# Patient Record
Sex: Male | Born: 1970 | Race: White | Hispanic: No | Marital: Married | Smoking: Current every day smoker
Health system: Southern US, Community
[De-identification: ages and names within clinical notes are randomized; demographics above are authoritative.]

## PROBLEM LIST (undated history)

## (undated) DIAGNOSIS — K219 Gastro-esophageal reflux disease without esophagitis: Secondary | ICD-10-CM

## (undated) DIAGNOSIS — S46219A Strain of muscle, fascia and tendon of other parts of biceps, unspecified arm, initial encounter: Secondary | ICD-10-CM

---

## 2011-03-19 ENCOUNTER — Encounter (HOSPITAL_BASED_OUTPATIENT_CLINIC_OR_DEPARTMENT_OTHER): Payer: Self-pay | Admitting: *Deleted

## 2011-03-20 ENCOUNTER — Other Ambulatory Visit: Payer: Self-pay | Admitting: Orthopedic Surgery

## 2011-03-21 ENCOUNTER — Encounter (HOSPITAL_BASED_OUTPATIENT_CLINIC_OR_DEPARTMENT_OTHER): Payer: Self-pay | Admitting: *Deleted

## 2011-03-21 ENCOUNTER — Encounter (HOSPITAL_BASED_OUTPATIENT_CLINIC_OR_DEPARTMENT_OTHER): Payer: Self-pay | Admitting: Orthopedic Surgery

## 2011-03-21 ENCOUNTER — Encounter (HOSPITAL_BASED_OUTPATIENT_CLINIC_OR_DEPARTMENT_OTHER)
Admission: RE | Disposition: A | Discharge status: Home / Self Care | Payer: Self-pay | Source: Ambulatory Visit | Attending: Orthopedic Surgery

## 2011-03-21 ENCOUNTER — Encounter (HOSPITAL_BASED_OUTPATIENT_CLINIC_OR_DEPARTMENT_OTHER): Payer: Self-pay | Admitting: Anesthesiology

## 2011-03-21 ENCOUNTER — Ambulatory Visit (HOSPITAL_BASED_OUTPATIENT_CLINIC_OR_DEPARTMENT_OTHER): Payer: Worker's Compensation | Admitting: Anesthesiology

## 2011-03-21 ENCOUNTER — Ambulatory Visit (HOSPITAL_BASED_OUTPATIENT_CLINIC_OR_DEPARTMENT_OTHER)
Admission: RE | Admit: 2011-03-21 | Discharge: 2011-03-21 | Disposition: A | Discharge status: Home / Self Care | Payer: Worker's Compensation | Source: Ambulatory Visit | Attending: Orthopedic Surgery | Admitting: Orthopedic Surgery

## 2011-03-21 DIAGNOSIS — M66329 Spontaneous rupture of flexor tendons, unspecified upper arm: Secondary | ICD-10-CM | POA: Insufficient documentation

## 2011-03-21 HISTORY — DX: Gastro-esophageal reflux disease without esophagitis: K21.9

## 2011-03-21 HISTORY — DX: Strain of muscle, fascia and tendon of other parts of biceps, unspecified arm, initial encounter: S46.219A

## 2011-03-21 HISTORY — PX: DISTAL BICEPS TENDON REPAIR: SHX1461

## 2011-03-21 SURGERY — REPAIR, TENDON, BICEPS, DISTAL
Anesthesia: Choice | Laterality: Right

## 2011-03-21 MED ORDER — OXYCODONE-ACETAMINOPHEN 5-325 MG PO TABS
1.0000 | ORAL_TABLET | Freq: Once | ORAL | Status: AC
  Administered 2011-03-21: 1 via ORAL

## 2011-03-21 MED ORDER — MIDAZOLAM HCL 2 MG/2ML IJ SOLN
1.0000 mg | INTRAMUSCULAR | Status: DC | PRN
  Administered 2011-03-21: 2 mg via INTRAVENOUS

## 2011-03-21 MED ORDER — BUPIVACAINE-EPINEPHRINE PF 0.5-1:200000 % IJ SOLN
INTRAMUSCULAR | Status: DC | PRN
  Administered 2011-03-21: 30 mL

## 2011-03-21 MED ORDER — MIDAZOLAM HCL 5 MG/5ML IJ SOLN
INTRAMUSCULAR | Status: DC | PRN
  Administered 2011-03-21: 2 mg via INTRAVENOUS

## 2011-03-21 MED ORDER — HYDROMORPHONE HCL PF 1 MG/ML IJ SOLN: 0.2500 mg | INTRAMUSCULAR | Status: DC | PRN

## 2011-03-21 MED ORDER — MORPHINE SULFATE 2 MG/ML IJ SOLN: 0.0500 mg/kg | INTRAMUSCULAR | Status: DC | PRN

## 2011-03-21 MED ORDER — LACTATED RINGERS IV SOLN
INTRAVENOUS | Status: DC
  Administered 2011-03-21 (×2): via INTRAVENOUS

## 2011-03-21 MED ORDER — CEFAZOLIN SODIUM-DEXTROSE 2-3 GM-% IV SOLR
2.0000 g | Freq: Once | INTRAVENOUS | Status: AC
  Administered 2011-03-21: 2 g via INTRAVENOUS

## 2011-03-21 MED ORDER — ONDANSETRON HCL 4 MG/2ML IJ SOLN: 4.0000 mg | Freq: Once | INTRAMUSCULAR | Status: DC | PRN

## 2011-03-21 MED ORDER — PROPOFOL 10 MG/ML IV EMUL
INTRAVENOUS | Status: DC | PRN
  Administered 2011-03-21: 250 mg via INTRAVENOUS

## 2011-03-21 MED ORDER — OXYCODONE-ACETAMINOPHEN 5-325 MG PO TABS: 1.0000 | ORAL_TABLET | ORAL | Status: DC | PRN

## 2011-03-21 MED ORDER — CHLORHEXIDINE GLUCONATE 4 % EX LIQD: 60.0000 mL | Freq: Once | CUTANEOUS | Status: DC

## 2011-03-21 MED ORDER — LIDOCAINE HCL (CARDIAC) 20 MG/ML IV SOLN
INTRAVENOUS | Status: DC | PRN
  Administered 2011-03-21: 30 mg via INTRAVENOUS

## 2011-03-21 MED ORDER — FENTANYL CITRATE 0.05 MG/ML IJ SOLN
INTRAMUSCULAR | Status: DC | PRN
  Administered 2011-03-21: 25 ug via INTRAVENOUS
  Administered 2011-03-21: 50 ug via INTRAVENOUS
  Administered 2011-03-21 (×5): 25 ug via INTRAVENOUS

## 2011-03-21 MED ORDER — FENTANYL CITRATE 0.05 MG/ML IJ SOLN
50.0000 ug | INTRAMUSCULAR | Status: DC | PRN
  Administered 2011-03-21: 100 ug via INTRAVENOUS

## 2011-03-21 MED ORDER — MEPERIDINE HCL 25 MG/ML IJ SOLN: 6.2500 mg | INTRAMUSCULAR | Status: DC | PRN

## 2011-03-21 MED ORDER — DEXAMETHASONE SODIUM PHOSPHATE 4 MG/ML IJ SOLN
INTRAMUSCULAR | Status: DC | PRN
  Administered 2011-03-21: 10 mg via INTRAVENOUS

## 2011-03-21 MED ORDER — OXYCODONE-ACETAMINOPHEN 5-325 MG PO TABS: 1.0000 | ORAL_TABLET | ORAL | Status: AC | PRN

## 2011-03-21 MED ORDER — ONDANSETRON HCL 4 MG/2ML IJ SOLN
INTRAMUSCULAR | Status: DC | PRN
  Administered 2011-03-21: 4 mg via INTRAVENOUS

## 2011-03-21 SURGICAL SUPPLY — 75 items
ACL TIGHTROPE DRILL PIN OPEN EYELET ×2 IMPLANT
ANCHOR BUTTON TIGHTROPE ABS (Orthopedic Implant) ×2 IMPLANT
BANDAGE GAUZE ELAST BULKY 4 IN (GAUZE/BANDAGES/DRESSINGS) ×2 IMPLANT
BLADE MINI RND TIP GREEN BEAV (BLADE) ×2 IMPLANT
BLADE SURG 15 STRL LF DISP TIS (BLADE) ×1 IMPLANT
BLADE SURG 15 STRL SS (BLADE) ×1
BNDG COHESIVE 3X5 TAN STRL LF (GAUZE/BANDAGES/DRESSINGS) ×4 IMPLANT
BNDG ESMARK 4X9 LF (GAUZE/BANDAGES/DRESSINGS) ×2 IMPLANT
BUTTON INSERTER ×2 IMPLANT
CHLORAPREP W/TINT 26ML (MISCELLANEOUS) ×2 IMPLANT
CLIP TI MEDIUM 6 (CLIP) ×2 IMPLANT
CLOTH BEACON ORANGE TIMEOUT ST (SAFETY) ×2 IMPLANT
CORDS BIPOLAR (ELECTRODE) ×2 IMPLANT
COVER MAYO STAND STRL (DRAPES) ×2 IMPLANT
COVER TABLE BACK 60X90 (DRAPES) ×2 IMPLANT
CUFF TOURNIQUET SINGLE 18IN (TOURNIQUET CUFF) IMPLANT
CUFF TOURNIQUET SINGLE 24IN (TOURNIQUET CUFF) ×2 IMPLANT
DECANTER SPIKE VIAL GLASS SM (MISCELLANEOUS) IMPLANT
DRAPE EXTREMITY T 121X128X90 (DRAPE) ×2 IMPLANT
DRAPE OEC MINIVIEW 54X84 (DRAPES) ×2 IMPLANT
DRAPE SURG 17X23 STRL (DRAPES) ×4 IMPLANT
DRSG KUZMA FLUFF (GAUZE/BANDAGES/DRESSINGS) IMPLANT
DRSG PAD ABDOMINAL 8X10 ST (GAUZE/BANDAGES/DRESSINGS) ×2 IMPLANT
FIBERLOOP #2 ×2 IMPLANT
GAUZE SPONGE 4X4 16PLY XRAY LF (GAUZE/BANDAGES/DRESSINGS) IMPLANT
GAUZE XEROFORM 1X8 LF (GAUZE/BANDAGES/DRESSINGS) ×2 IMPLANT
GLOVE BIO SURGEON STRL SZ 6.5 (GLOVE) ×2 IMPLANT
GLOVE BIO SURGEON STRL SZ7.5 (GLOVE) ×2 IMPLANT
GLOVE BIO SURGEON STRL SZ8 (GLOVE) IMPLANT
GLOVE BIOGEL PI IND STRL 8.5 (GLOVE) IMPLANT
GLOVE BIOGEL PI INDICATOR 8.5 (GLOVE)
GLOVE INDICATOR 8.0 STRL GRN (GLOVE) ×2 IMPLANT
GLOVE SURG ORTHO 8.0 STRL STRW (GLOVE) ×2 IMPLANT
GOWN BRE IMP PREV XXLGXLNG (GOWN DISPOSABLE) ×2 IMPLANT
GOWN PREVENTION PLUS XLARGE (GOWN DISPOSABLE) ×2 IMPLANT
KIT BIO-TENODESIS 3X8 DISP (MISCELLANEOUS)
KIT INSRT BABSR STRL DISP BTN (MISCELLANEOUS) IMPLANT
LOOP VESSEL MAXI BLUE (MISCELLANEOUS) IMPLANT
NDL SUT 6 .5 CRC .975X.05 MAYO (NEEDLE) ×1 IMPLANT
NEEDLE 1/2 CIR CATGUT .05X1.09 (NEEDLE) IMPLANT
NEEDLE 27GAX1X1/2 (NEEDLE) IMPLANT
NEEDLE ADDISON D1/2 CIR (NEEDLE) IMPLANT
NEEDLE HYPO 22GX1.5 SAFETY (NEEDLE) IMPLANT
NEEDLE MAYO TAPER (NEEDLE) ×1
NS IRRIG 1000ML POUR BTL (IV SOLUTION) ×2 IMPLANT
PACK BASIN DAY SURGERY FS (CUSTOM PROCEDURE TRAY) ×2 IMPLANT
PAD CAST 4YDX4 CTTN HI CHSV (CAST SUPPLIES) ×2 IMPLANT
PADDING CAST ABS 3INX4YD NS (CAST SUPPLIES)
PADDING CAST ABS 4INX4YD NS (CAST SUPPLIES) ×1
PADDING CAST ABS COTTON 3X4 (CAST SUPPLIES) IMPLANT
PADDING CAST ABS COTTON 4X4 ST (CAST SUPPLIES) ×1 IMPLANT
PADDING CAST COTTON 4X4 STRL (CAST SUPPLIES) ×2
SLEEVE SCD COMPRESS KNEE MED (MISCELLANEOUS) ×2 IMPLANT
SPLINT PLASTER CAST XFAST 3X15 (CAST SUPPLIES) ×30 IMPLANT
SPLINT PLASTER XTRA FASTSET 3X (CAST SUPPLIES) ×30
SPONGE GAUZE 4X4 12PLY (GAUZE/BANDAGES/DRESSINGS) ×2 IMPLANT
SPONGE LAP 4X18 X RAY DECT (DISPOSABLE) ×2 IMPLANT
STOCKINETTE 4X48 STRL (DRAPES) ×2 IMPLANT
SUT 2 FIBERLOOP 20 STRT BLUE (SUTURE)
SUT ETHIBOND 0 MO6 C/R (SUTURE) IMPLANT
SUT FIBERWIRE #2 38 T-5 BLUE (SUTURE)
SUT MERSILENE 3 0 FS 1 (SUTURE) IMPLANT
SUT VIC AB 4-0 BRD 54 (SUTURE) IMPLANT
SUT VIC AB 4-0 P-3 18XBRD (SUTURE) IMPLANT
SUT VIC AB 4-0 P3 18 (SUTURE)
SUT VICRYL 4-0 PS2 18IN ABS (SUTURE) ×2 IMPLANT
SUT VICRYL RAPID 5 0 P 3 (SUTURE) IMPLANT
SUT VICRYL RAPIDE 4/0 PS 2 (SUTURE) ×4 IMPLANT
SUTURE 2 FIBERLOOP 20 STRT BLU (SUTURE) IMPLANT
SUTURE FIBERWR #2 38 T-5 BLUE (SUTURE) IMPLANT
SYR BULB 3OZ (MISCELLANEOUS) ×2 IMPLANT
SYR CONTROL 10ML LL (SYRINGE) IMPLANT
TOWEL OR 17X24 6PK STRL BLUE (TOWEL DISPOSABLE) ×2 IMPLANT
UNDERPAD 30X30 INCONTINENT (UNDERPADS AND DIAPERS) ×2 IMPLANT
WATER STERILE IRR 1000ML POUR (IV SOLUTION) ×2 IMPLANT

## 2011-03-21 NOTE — H&P (Signed)
   Travis Lester is a 40 year-old right-hand dominant male who was injured at work on 10/29.  He felt a pop in his elbow and was seen at [facility], felt to have a ruptured biceps. He has no prior history of injury.  This was to his right wrist.  He was attempting to lift an object at work when it gave way. He suffered a hyperextension type injury when attempted to regrab this. He complains of constant, moderate sharp pain with a feeling of weakness.  He states it has gotten worse.  He was given Norco and a splint to wear.  He has no prior history of injuries, no history of diabetes, thyroid problems, arthritis or gout. Travis Lester is an 40 y.o. male.   Chief Complaint: ruptured biceps HPI: see above  Past Medical History  Diagnosis Date  . GERD (gastroesophageal reflux disease)   . Biceps tendon rupture, traumatic     right     History reviewed. No pertinent past surgical history.  History reviewed. No pertinent family history. Social History:  reports that he has been smoking Cigarettes.  He has a 45 pack-year smoking history. He has never used smokeless tobacco. He reports that he drinks alcohol. He reports that he does not use illicit drugs.  Allergies: No Known Allergies  Medications Prior to Admission  Medication Dose Route Frequency Provider Last Rate Last Dose  . ceFAZolin (ANCEF) IVPB 2 g/50 mL premix  2 g Intravenous Once Nicki Reaper, MD      . fentaNYL (SUBLIMAZE) injection 50-100 mcg  50-100 mcg Intravenous PRN Kerby Nora, MD   100 mcg at 03/21/11 1210  . lactated ringers infusion   Intravenous Continuous Zenon Mayo, MD 20 mL/hr at 03/21/11 1115    . midazolam (VERSED) injection 1-2 mg  1-2 mg Intravenous PRN Kerby Nora, MD   2 mg at 03/21/11 1210   Medications Prior to Admission  Medication Sig Dispense Refill  . HYDROcodone-acetaminophen (NORCO) 5-325 MG per tablet Take 1 tablet by mouth as needed.          No results found for this or any previous visit (from  the past 48 hour(s)).  No results found.   A comprehensive review of systems was negative.  Blood pressure 128/95, pulse 85, temperature 98.2 F (36.8 C), temperature source Oral, resp. rate 20, height 6' (1.829 m), weight 127.007 kg (280 lb), SpO2 100.00%.  General appearance: alert, cooperative and appears stated age Head: Normocephalic, without obvious abnormality, atraumatic Neck: no adenopathy Resp: clear to auscultation bilaterally Cardio: regular rate and rhythm, S1, S2 normal, no murmur, click, rub or gallop GI: soft, non-tender; bowel sounds normal; no masses,  no organomegaly Extremities: extremities normal, atraumatic, no cyanosis or edema Pulses: 2+ and symmetric Skin: Skin color, texture, turgor normal. No rashes or lesions Neurologic: Grossly normal Incision/Wound: na  Assessment/Plan reair biceps  Travis Lester R 03/21/2011, 1:11 PM

## 2011-03-21 NOTE — Anesthesia Preprocedure Evaluation (Addendum)
Anesthesia Evaluation  Patient identified by MRN, date of birth, ID band Patient awake    Reviewed: Allergy & Precautions, H&P , NPO status , Patient's Chart, lab work & pertinent test results  Airway Mallampati: I TM Distance: >3 FB Neck ROM: Full    Dental  (+) Teeth Intact and Dental Advisory Given   Pulmonary          Cardiovascular     Neuro/Psych    GI/Hepatic GERD-  Medicated and Controlled,  Endo/Other    Renal/GU      Musculoskeletal   Abdominal   Peds  Hematology   Anesthesia Other Findings   Reproductive/Obstetrics                          Anesthesia Physical Anesthesia Plan  ASA: II  Anesthesia Plan: General   Post-op Pain Management:    Induction: Intravenous  Airway Management Planned: LMA  Additional Equipment:   Intra-op Plan:   Post-operative Plan: Extubation in OR  Informed Consent: I have reviewed the patients History and Physical, chart, labs and discussed the procedure including the risks, benefits and alternatives for the proposed anesthesia with the patient or authorized representative who has indicated his/her understanding and acceptance.   Dental advisory given  Plan Discussed with: CRNA and Surgeon  Anesthesia Plan Comments:         Anesthesia Quick Evaluation

## 2011-03-21 NOTE — Op Note (Signed)
An incision was  made two finger breaths below the flexion crease in the right antecubital fossa brought proximally to the ulnar side of the upper arm, carried down through subcutaneous tissue.  The fascia was split. The nerves were identified and protected.  Recurrent branches were clamped or tied and transected.  This allowed mobilization of the artery away from the biceps tendon, which was identified and found to be detached distally . The area of loss of fixation was then roughened, a 4mm drill hole was placed for Arthrex retrobutton, the tendon was isolated, a 2-0 FiberWire suture was then passed in a retrograde manner through the tendon with a Krakhour suture going both proximally and distally.  The Retrobutton  was then placed thru the bicipital tuberosity into the shaft of the radius turned down itself and the tendon pulled into the defect created, firmly fixing the biceps back down of the tubercle of the radius for insertion of the biceps tendon.  X-rays confirmed positioning of the anchor inside the canal with firm fixation of the biceps tendon, full flexion, full extension.  The wound was irrigated.  The subcutaneous tissue closed with interrupted 4-0 Vicryl, and the skin with interrupted 4-0 Vicryl Rapide sutures.  Sterile compressive dressing, long-arm splint in supination was applied. On deflation of the tourniquet, all fingers immediately pinked. She was taken to the recovery room for observation in satisfactory condition

## 2011-03-21 NOTE — Anesthesia Postprocedure Evaluation (Signed)
  Anesthesia Post-op Note  Patient: Travis Lester  Procedure(s) Performed:  DISTAL BICEPS TENDON REPAIR - Danny knows  Patient Location: PACU  Anesthesia Type: GA combined with regional for post-op pain  Level of Consciousness: awake and alert   Airway and Oxygen Therapy: Patient Spontanous Breathing and Patient connected to face mask oxygen  Post-op Pain: mild  Post-op Assessment: Post-op Vital signs reviewed, Patient's Cardiovascular Status Stable, Respiratory Function Stable, Patent Airway, No signs of Nausea or vomiting and Pain level controlled  Post-op Vital Signs: Reviewed and stable  Complications: No apparent anesthesia complications

## 2011-03-21 NOTE — Brief Op Note (Signed)
03/21/2011  3:12 PM  PATIENT:  Travis Lester  40 y.o. male  PRE-OPERATIVE DIAGNOSIS:  rupture biceps on right  POST-OPERATIVE DIAGNOSIS:  Right Ruptured Biceps  PROCEDURE:  Procedure(s): DISTAL BICEPS TENDON REPAIR  SURGEON:  Surgeon(s): Nicki Reaper, MD Tami Ribas  PHYSICIAN ASSISTANT:   ASSISTANTS: Georga Bora   ANESTHESIA:   regional and general  EBL:  Total I/O In: 1300 [I.V.:1300] Out: -   BLOOD ADMINISTERED:none  DRAINS: none   LOCAL MEDICATIONS USED:  NONE  SPECIMEN:  No Specimen  DISPOSITION OF SPECIMEN:  N/A  COUNTS:  YES  TOURNIQUET:   Total Tourniquet Time Documented: area (laterality) - 58 minutes  DICTATION: .Note written in EPIC  PLAN OF CARE: Admit for overnight observation  PATIENT DISPOSITION:  PACU - hemodynamically stable.   Delay start of Pharmacological VTE agent (>24hrs) due to surgical blood loss or risk of bleeding:  {YES/NO/NOT APPLICABLE:20182

## 2011-03-21 NOTE — Addendum Note (Signed)
Addendum  created 03/21/11 1551 by Kerby Nora, MD   Modules edited:Anesthesia Attestations

## 2011-03-21 NOTE — Addendum Note (Signed)
Addendum  created 03/21/11 1550 by Kerby Nora, MD   Modules edited:Anesthesia Attestations

## 2011-03-21 NOTE — Anesthesia Procedure Notes (Addendum)
Anesthesia Regional Block:  Supraclavicular block  Pre-Anesthetic Checklist: ,, timeout performed, Correct Patient, Correct Site, Correct Laterality, Correct Procedure, Correct Position, site marked, Risks and benefits discussed,  Surgical consent,  Pre-op evaluation,  At surgeon's request and post-op pain management  Laterality: Right and Upper  Prep: chloraprep       Needles:  Injection technique: Single-shot  Needle Type: Echogenic Stimulator Needle     Needle Length: 5cm 5 cm Needle Gauge: 22 and 22 G    Additional Needles:  Procedures: ultrasound guided Supraclavicular block Narrative:  Start time: 03/21/2011 12:15 PM End time: 03/21/2011 12:30 PM Injection made incrementally with aspirations every 5 mL.  Performed by: Personally  Anesthesiologist: Sheldon Silvan  Supraclavicular block Procedure Name: LMA Insertion Date/Time: 03/21/2011 1:37 PM Performed by: Jearld Shines Pre-anesthesia Checklist: Patient identified, Timeout performed, Emergency Drugs available, Suction available and Patient being monitored Patient Re-evaluated:Patient Re-evaluated prior to inductionOxygen Delivery Method: Circle System Utilized Preoxygenation: Pre-oxygenation with 100% oxygen Intubation Type: IV induction Ventilation: Mask ventilation without difficulty LMA: LMA with gastric port inserted LMA Size: 5.0 Number of attempts: 1 Placement Confirmation: positive ETCO2 and breath sounds checked- equal and bilateral Tube secured with: Tape Dental Injury: Teeth and Oropharynx as per pre-operative assessment     Performed by: Jearld Shines

## 2011-03-21 NOTE — Transfer of Care (Signed)
Immediate Anesthesia Transfer of Care Note  Patient: Travis Lester  Procedure(s) Performed:  DISTAL BICEPS TENDON REPAIR - Danny knows  Patient Location: PACU  Anesthesia Type: GA combined with regional for post-op pain  Level of Consciousness: sedated  Airway & Oxygen Therapy: Patient Spontanous Breathing and Patient connected to face mask oxygen  Post-op Assessment: Report given to PACU RN and Post -op Vital signs reviewed and stable  Post vital signs: Reviewed and stable  Complications: No apparent anesthesia complications

## 2011-04-05 ENCOUNTER — Encounter (HOSPITAL_BASED_OUTPATIENT_CLINIC_OR_DEPARTMENT_OTHER): Payer: Self-pay | Admitting: Orthopedic Surgery

## 2019-09-07 ENCOUNTER — Ambulatory Visit
Admission: EM | Admit: 2019-09-07 | Discharge: 2019-09-07 | Disposition: A | Discharge status: Home / Self Care | Payer: Self-pay | Attending: Emergency Medicine | Admitting: Emergency Medicine

## 2019-09-07 ENCOUNTER — Ambulatory Visit (INDEPENDENT_AMBULATORY_CARE_PROVIDER_SITE_OTHER): Payer: Medicaid Other

## 2019-09-07 ENCOUNTER — Encounter: Payer: Self-pay | Admitting: Emergency Medicine

## 2019-09-07 ENCOUNTER — Other Ambulatory Visit: Payer: Self-pay

## 2019-09-07 DIAGNOSIS — S62356A Nondisplaced fracture of shaft of fifth metacarpal bone, right hand, initial encounter for closed fracture: Secondary | ICD-10-CM

## 2019-09-07 DIAGNOSIS — M79641 Pain in right hand: Secondary | ICD-10-CM | POA: Diagnosis not present

## 2019-09-07 MED ORDER — PREDNISONE 10 MG PO TABS: 20.0000 mg | ORAL_TABLET | Freq: Every day | ORAL | 0 refills | Status: AC

## 2019-09-07 MED ORDER — IBUPROFEN 800 MG PO TABS: 800.0000 mg | ORAL_TABLET | Freq: Three times a day (TID) | ORAL | 0 refills | Status: AC

## 2019-09-07 NOTE — ED Notes (Signed)
Provider to apply splint.

## 2019-09-07 NOTE — ED Provider Notes (Signed)
[facility]    CSN: [id number] Arrival date & time: 09/07/19  1000      History   Chief Complaint Chief Complaint  Patient presents with  . Hand Injury  . Fall    HPI Travis Lester is a 49 y.o. male.   Who presented to the urgent care with a complaint of right hand pain and swelling for the past 2 days.  Reported he fell and tripped over a garden hose and basically punches the ground with his hand.  He localizes the pain to the to the right hand around the fifth metacarpal.  He describes the pain as constant and achy rated at 7 on a scale 1-10.  He has tried OTC medications without relief.  His symptoms are made worse with ROM.  He denies similar symptoms in the past.  Denies chills, fever, nausea, vomiting, diarrhea.  The history is provided by the patient. No language interpreter was used.  Hand Injury Fall    Past Medical History:  Diagnosis Date  . Biceps tendon rupture, traumatic    right   . GERD (gastroesophageal reflux disease)     There are no problems to display for this patient.   Past Surgical History:  Procedure Laterality Date  . DISTAL BICEPS TENDON REPAIR  03/21/2011   Procedure: DISTAL BICEPS TENDON REPAIR;  Surgeon: Wynonia Sours, MD;  Location: [place];  Service: Orthopedics;  Laterality: Right;  Danny knows       Home Medications    Prior to Admission medications   Medication Sig Start Date End Date Taking? Authorizing Provider  ibuprofen (ADVIL) 800 MG tablet Take 1 tablet (800 mg total) by mouth 3 (three) times daily. 09/07/19   Haruka Kowaleski, Darrelyn Hillock, FNP  predniSONE (DELTASONE) 10 MG tablet Take 2 tablets (20 mg total) by mouth daily. 09/07/19   Rooney Swails, Darrelyn Hillock, FNP    Family History History reviewed. No pertinent family history.  Social History Social History   Tobacco Use  . Smoking status: Current Every Day Smoker    Packs/day: 1.50    Years: 30.00    Pack years: 45.00    Types: Cigarettes  .  Smokeless tobacco: Never Used  Substance Use Topics  . Alcohol use: Yes    Comment: wkends.  . Drug use: No     Allergies   Patient has no known allergies.   Review of Systems Review of Systems  Constitutional: Negative.   Respiratory: Negative.   Cardiovascular: Negative.   Musculoskeletal: Positive for arthralgias and joint swelling.  All other systems reviewed and are negative.    Physical Exam Triage Vital Signs ED Triage Vitals  Enc Vitals Group     BP 09/07/19 1017 (!) 146/101     Pulse Rate 09/07/19 1017 86     Resp 09/07/19 1017 18     Temp 09/07/19 1017 97.9 F (36.6 C)     Temp Source 09/07/19 1017 Oral     SpO2 09/07/19 1017 94 %     Weight --      Height --      Head Circumference --      Peak Flow --      Pain Score 09/07/19 1014 7     Pain Loc --      Pain Edu? --      Excl. in [place]? --    No data found.  Updated Vital Signs BP (!) 146/101 (BP Location: Right Arm) Comment (BP  Location): large  Pulse 86   Temp 97.9 F (36.6 C) (Oral)   Resp 18   SpO2 94%   Visual Acuity Right Eye Distance:   Left Eye Distance:   Bilateral Distance:    Right Eye Near:   Left Eye Near:    Bilateral Near:     Physical Exam Vitals and nursing note reviewed.  Constitutional:      General: He is not in acute distress.    Appearance: Normal appearance. He is normal weight. He is not ill-appearing, toxic-appearing or diaphoretic.  Cardiovascular:     Rate and Rhythm: Normal rate and regular rhythm.     Pulses: Normal pulses.     Heart sounds: Normal heart sounds. No murmur. No friction rub. No gallop.   Pulmonary:     Effort: Pulmonary effort is normal. No respiratory distress.     Breath sounds: Normal breath sounds. No stridor. No wheezing, rhonchi or rales.  Chest:     Chest wall: No tenderness.  Musculoskeletal:        General: Swelling and tenderness present.     Right hand: Swelling and tenderness present.     Left hand: Normal.     Comments: The  right hand is with obvious deformity when compared to the left hand.  Swelling present.  No erythema, atrophy, open wound, nail avulsion, tissue avulsion partial or complete amputation, subungual hematoma.  Normal cascade of fingers.  Limited flexion and extension of finger.  Pulse and capillary refill is intact  Skin:    Capillary Refill: Capillary refill takes less than 2 seconds.  Neurological:     General: No focal deficit present.     Mental Status: He is alert and oriented to person, place, and time.     Cranial Nerves: No cranial nerve deficit.     Sensory: No sensory deficit.     Motor: No weakness.     Coordination: Coordination normal.     Gait: Gait normal.     Deep Tendon Reflexes: Reflexes normal.      UC Treatments / Results  Labs (all labs ordered are listed, but only abnormal results are displayed) Labs Reviewed - No data to display  EKG   Radiology DG Hand Complete Right  Result Date: 09/07/2019 CLINICAL DATA:  Pain following fall EXAM: RIGHT HAND - COMPLETE 3+ VIEW COMPARISON:  None. FINDINGS: Frontal, oblique, and lateral views were obtained. There is marked soft tissue swelling dorsally. There is a comminuted fracture of the distal aspect of the fifth metacarpal with mild displacement of a distal fracture fragment medially with respect to the other fragments. No other fracture. No dislocation. Joint spaces appear unremarkable. No erosive change. IMPRESSION: Comminuted fracture distal fifth metacarpal with marked soft tissue swelling in this area. No fractures are evident. No dislocation. No appreciable underlying arthropathic change. These results will be called to the ordering clinician or representative by the Radiologist Assistant, and communication documented in the PACS or [employer]. Electronically Signed   By: Bretta Bang III M.D.   On: 09/07/2019 10:30    Procedures Procedures (including critical care time)  Medications Ordered in  UC Medications - No data to display  Initial Impression / Assessment and Plan / UC Course  I have reviewed the triage vital signs and the nursing notes.  Pertinent labs & imaging results that were available during my care of the patient were reviewed by me and considered in my medical decision making (see chart for details).  X-ray is positive for acute fracture of the fifth metacarpal.  I have reviewed the x-ray myself and the radiologist interpretation.  I am in agreement with the radiologist interpretation.  Final Clinical Impressions(s) / UC Diagnoses   Final diagnoses:  Closed nondisplaced fracture of shaft of fifth metacarpal bone of right hand, initial encounter  Hand pain, right     Discharge Instructions     Samsplint was applied in office Follow-up with orthopedic Take ibuprofen and prednisone as needed for pain Return or go to ED for worsening symptoms    ED Prescriptions    Medication Sig Dispense Auth. Provider   predniSONE (DELTASONE) 10 MG tablet Take 2 tablets (20 mg total) by mouth daily. 15 tablet Shandora Koogler S, FNP   ibuprofen (ADVIL) 800 MG tablet Take 1 tablet (800 mg total) by mouth 3 (three) times daily. 30 tablet Dalal Livengood, Zachery Dakins, FNP     PDMP not reviewed this encounter.   Durward Parcel, FNP 09/07/19 1046

## 2019-09-07 NOTE — ED Triage Notes (Signed)
Patient reports he tripped over garden hose on Saturday, 09/05/2019.  Patient has swollen and painful right hand.  Brisk cap refill to right finger nails.  Able to move fingers, little finger has least mobility

## 2019-09-07 NOTE — Discharge Instructions (Signed)
Samsplint was applied in office Follow-up with orthopedic Take ibuprofen and prednisone as needed for pain Return or go to ED for worsening symptoms

## 2019-09-08 ENCOUNTER — Encounter: Payer: Self-pay | Admitting: Orthopedic Surgery

## 2019-09-08 ENCOUNTER — Ambulatory Visit: Payer: Self-pay | Admitting: Orthopedic Surgery

## 2019-11-05 DIAGNOSIS — Z419 Encounter for procedure for purposes other than remedying health state, unspecified: Secondary | ICD-10-CM | POA: Diagnosis not present

## 2019-12-06 DIAGNOSIS — Z419 Encounter for procedure for purposes other than remedying health state, unspecified: Secondary | ICD-10-CM | POA: Diagnosis not present

## 2020-01-06 DIAGNOSIS — Z419 Encounter for procedure for purposes other than remedying health state, unspecified: Secondary | ICD-10-CM | POA: Diagnosis not present

## 2020-02-05 DIAGNOSIS — Z419 Encounter for procedure for purposes other than remedying health state, unspecified: Secondary | ICD-10-CM | POA: Diagnosis not present

## 2020-03-07 DIAGNOSIS — Z419 Encounter for procedure for purposes other than remedying health state, unspecified: Secondary | ICD-10-CM | POA: Diagnosis not present

## 2020-04-06 DIAGNOSIS — Z419 Encounter for procedure for purposes other than remedying health state, unspecified: Secondary | ICD-10-CM | POA: Diagnosis not present

## 2020-05-07 DIAGNOSIS — Z419 Encounter for procedure for purposes other than remedying health state, unspecified: Secondary | ICD-10-CM | POA: Diagnosis not present

## 2020-06-07 DIAGNOSIS — Z419 Encounter for procedure for purposes other than remedying health state, unspecified: Secondary | ICD-10-CM | POA: Diagnosis not present

## 2021-09-02 IMAGING — DX DG HAND COMPLETE 3+V*R*
3 series · 3 of 3 positions shown · non-contrast
Comparison: None.

CLINICAL DATA: Pain following fall

EXAM:
RIGHT HAND - COMPLETE 3+ VIEW

[hand pa]
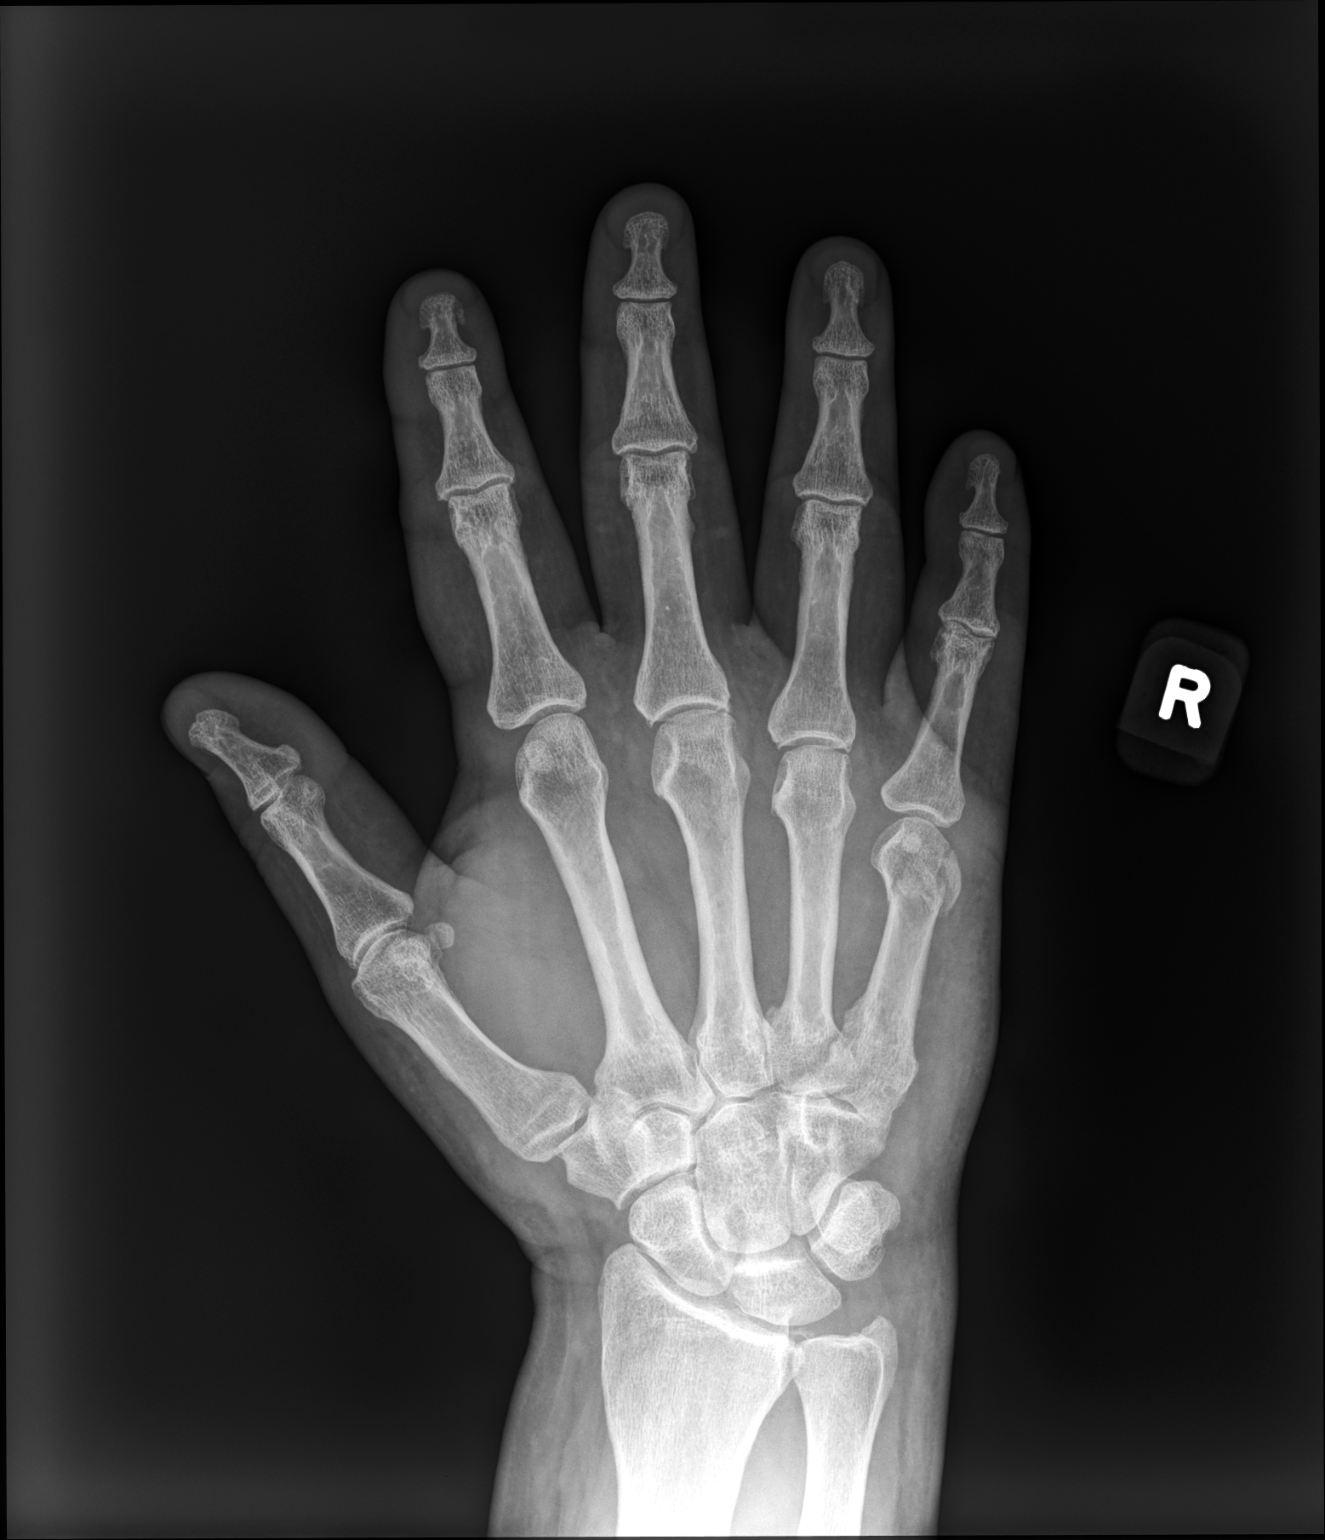

[hand mlo]
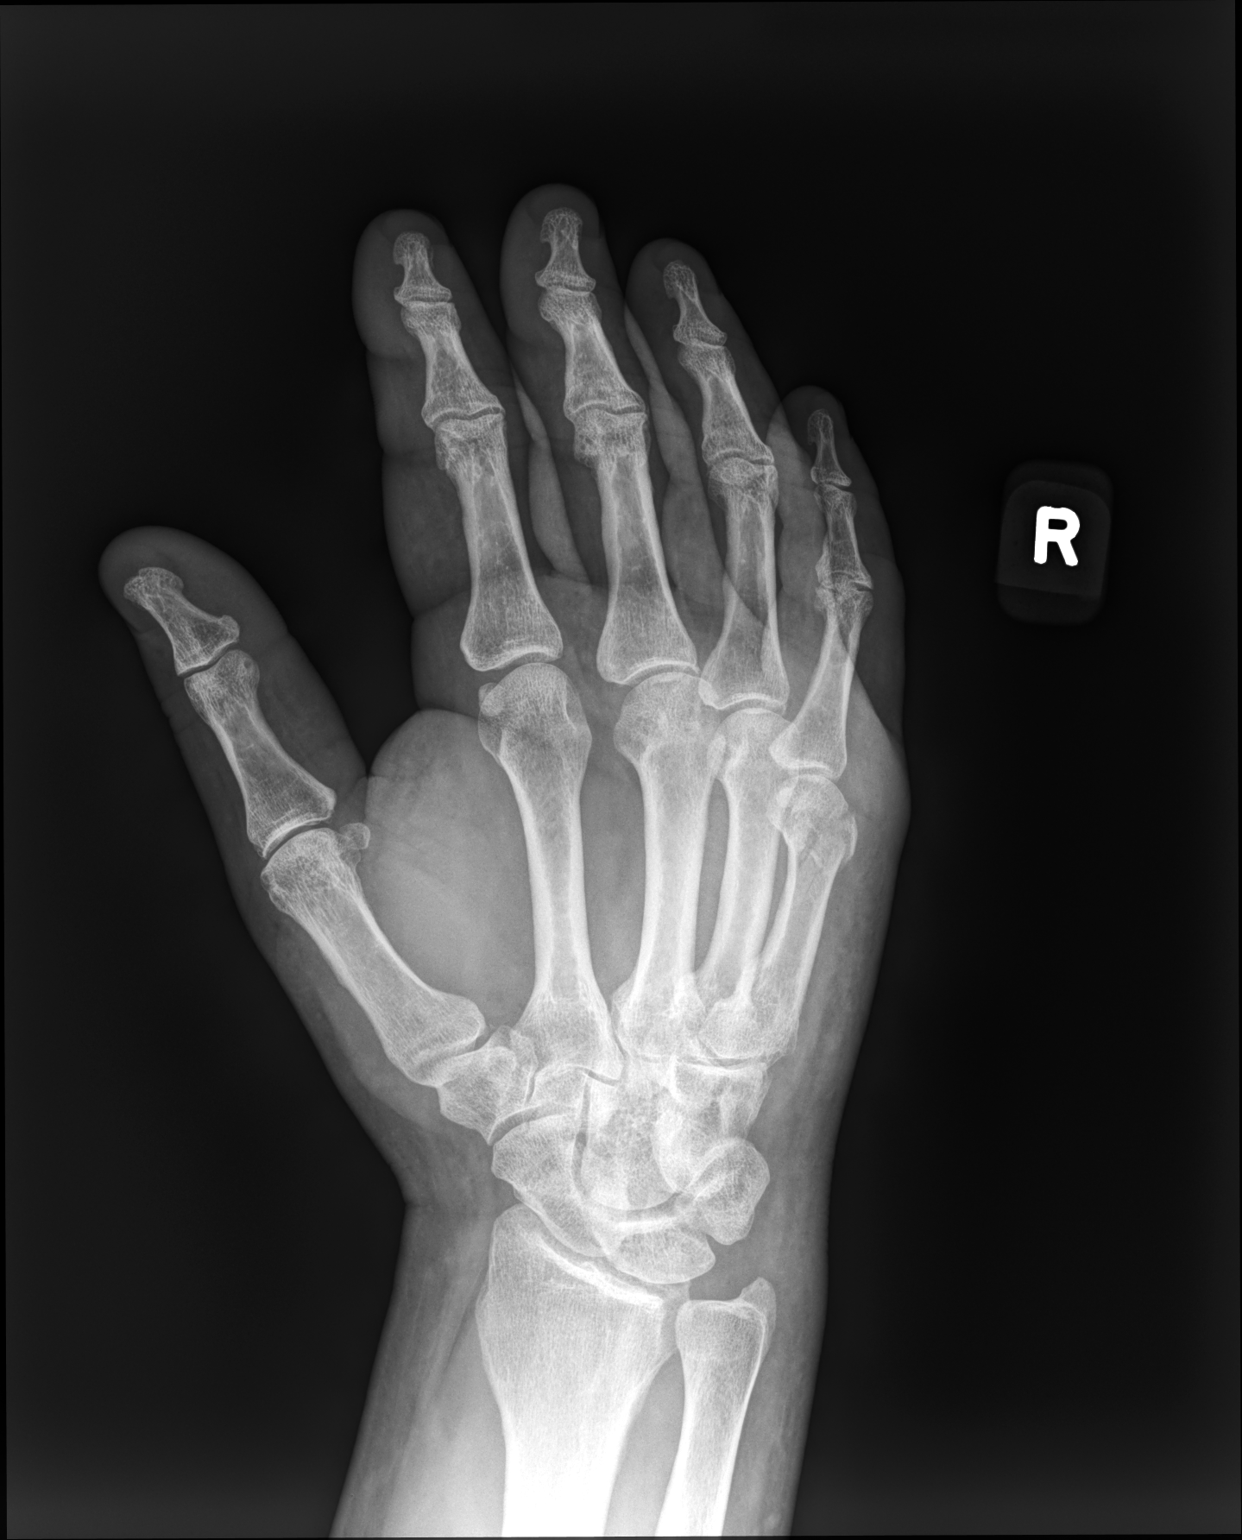

[hand lat]
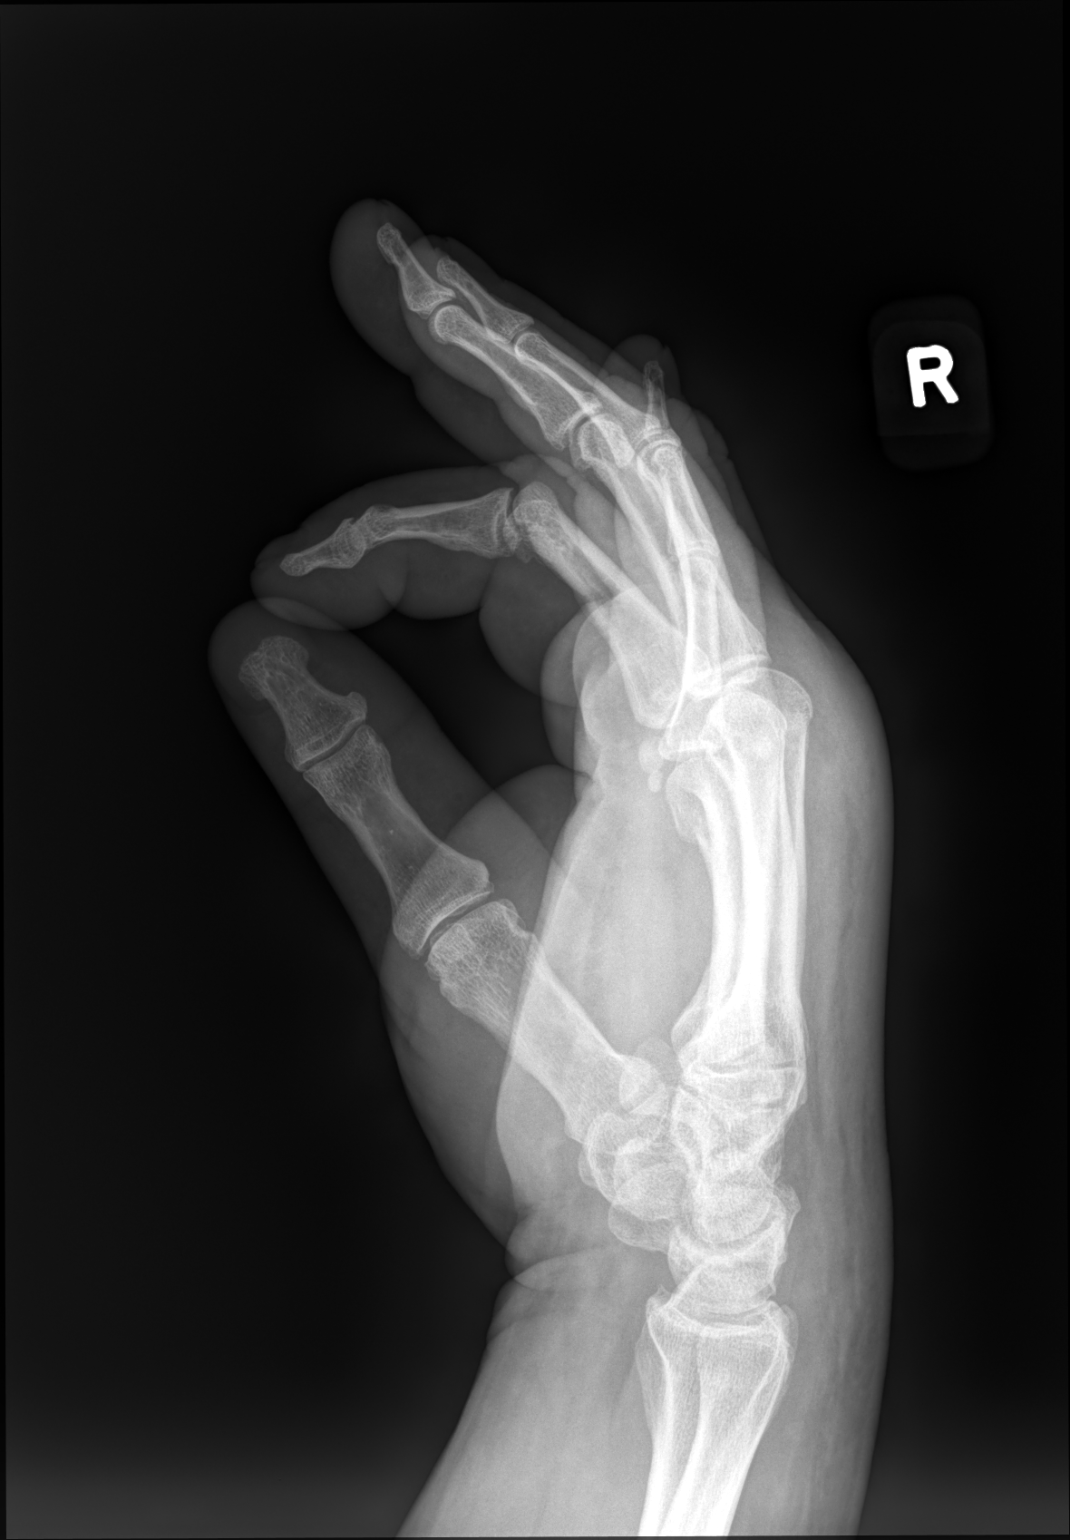

[3 of 3 positions shown; findings below may reference images not displayed]

FINDINGS: Frontal, oblique, and lateral views were obtained. There is marked
soft tissue swelling dorsally. There is a comminuted fracture of the
distal aspect of the fifth metacarpal with mild displacement of a
distal fracture fragment medially with respect to the other
fragments. No other fracture. No dislocation. Joint spaces appear
unremarkable. No erosive change.
IMPRESSION: Comminuted fracture distal fifth metacarpal with marked soft tissue
swelling in this area. No fractures are evident. No dislocation. No
appreciable underlying arthropathic change.

These results will be called to the ordering clinician or
representative by the Radiologist Assistant, and communication
documented in the PACS or [REDACTED].

## 2022-07-05 ENCOUNTER — Encounter: Payer: Self-pay | Admitting: Radiology
